# Patient Record
Sex: Female | Born: 2005 | Race: Asian | Hispanic: No | Marital: Single | Smoking: Never smoker
Health system: Southern US, Community
[De-identification: ages and names within clinical notes are randomized; demographics above are authoritative.]

## PROBLEM LIST (undated history)

## (undated) DIAGNOSIS — K5900 Constipation, unspecified: Secondary | ICD-10-CM

---
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
## 2023-01-01 ENCOUNTER — Ambulatory Visit: Payer: No Typology Code available for payment source | Admitting: Sports Medicine

## 2023-01-03 ENCOUNTER — Telehealth: Payer: Self-pay

## 2023-01-03 NOTE — Telephone Encounter (Signed)
Patients mother called wanting to go over results, I informed mother that Dr. Georgina Snell is out of town so we do have the results but it has not been read by Dr. Georgina Snell and he can look at it on Monday. She did seem sort of concerned and I did let her know the findings stating normal rotator cuff and normal bicep tendon we were just not positive on the other lingo. Informed mother that we would call her Monday with further results and with next steps.

## 2023-01-07 NOTE — Progress Notes (Signed)
Shoulder MRI shows intact rotator cuff tendons and muscles.  No biceps tendon tear glenoid labrum tear.  The inferior glenoid ligament does appear to be thinned out but not torn.  This may be an overuse issue.  Recommend return to clinic to go over the results in full detail and talk about treatment options.  We may get a second opinion from a surgeon to get his or her opinion.

## 2023-01-07 NOTE — Telephone Encounter (Signed)
Per Dr. Georgina Snell:   Gregor Hams, MD 01/07/2023  6:45 AM EDT     Shoulder MRI shows intact rotator cuff tendons and muscles.  No biceps tendon tear glenoid labrum tear.  The inferior glenoid ligament does appear to be thinned out but not torn.  This may be an overuse issue.  Recommend return to clinic to go over the results in full detail and talk about treatment options.  We may get a second opinion from a surgeon to get his or her opinion.

## 2023-01-09 ENCOUNTER — Encounter: Payer: Self-pay | Admitting: Family Medicine

## 2023-01-09 ENCOUNTER — Ambulatory Visit (INDEPENDENT_AMBULATORY_CARE_PROVIDER_SITE_OTHER): Payer: No Typology Code available for payment source | Admitting: Family Medicine

## 2023-01-09 ENCOUNTER — Ambulatory Visit: Payer: No Typology Code available for payment source | Admitting: Family Medicine

## 2023-01-09 VITALS — BP 102/64 | HR 57 | Ht 64.0 in

## 2023-01-09 DIAGNOSIS — M25511 Pain in right shoulder: Secondary | ICD-10-CM

## 2023-01-09 DIAGNOSIS — G8929 Other chronic pain: Secondary | ICD-10-CM | POA: Diagnosis not present

## 2023-01-09 NOTE — Telephone Encounter (Signed)
Pt has appointment today at 2:00 to discuss.

## 2023-01-09 NOTE — Progress Notes (Unsigned)
I, Alexa Burke, CMA acting as a scribe for Lynne Leader, MD.  Alexa Burke is a 17 y.o. female who presents to [place] [facility] today for follow-up right shoulder pain. 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 Pt was last seen by Dr. Georgina Snell on 12/04/22 and was referred to [facility] PT.  Patient's mom contacted the office on 12/14/2022 reporting frustration with PT and worsening pain and an MRI arthrogram was ordered.  Today, patient reports no change in sx since last visit. No longer going to PT but continues to do HEP provided daily. No meds for sx currently.   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   MSK: Right shoulder normal motion.    Lab and Radiology Results  EXAM: MRI OF THE RIGHT SHOULDER WITH CONTRAST   TECHNIQUE: Multiplanar, multisequence MR imaging of the right shoulder was performed following the administration of intra-articular contrast.   CONTRAST:  See Injection Documentation.   COMPARISON:  Right shoulder radiographs 12/04/2022   FINDINGS: Rotator cuff: The rotator cuff tendons are intact. No partial or full-thickness tear. No significant tendinopathy.   Muscles: Normal   Biceps long head: Normal   Acromioclavicular Joint: No degenerative changes. The acromion is type 1-2 in shape. No lateral downsloping or subacromial spurring.   Glenohumeral Joint: Normal articular cartilage. No loose bodies are identified. There is a very patulous axillary recess and elongated and thinned appearing anterior and posterior bands of the  inferior glenohumeral ligament but no evidence of tear or avulsion.   Labrum: The glenoid labrum and glenohumeral ligaments are intact. The anterior labrocapsular complex is intact on the ABER sequence.   Bones: No acute bony findings.  No bone contusion or marrow edema.   IMPRESSION: 1. Normal rotator cuff tendons and shoulder musculature. 2. Intact long head biceps tendon, glenoid labrum and glenohumeral ligaments. 3. Very patulous axillary recess and elongated and thinned appearing anterior and posterior bands of the inferior glenohumeral ligament but no evidence of tear or avulsion.     Electronically Signed   By: Marijo Sanes M.D.   On: 01/02/2023 15:28 I, Lynne Leader, personally (independently) visualized and performed the interpretation of the images attached in this note.    Assessment and Plan: 17 y.o. 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  Her pain is improving a bit with relative rest away from tennis with a graduated rehab program.  MRI did not show clear obvious tear but does show some thinning of the inferior glenoid ligament on MRI arthrogram. I am concerned she may have more pathology than the MRI showed.  On the differential is thoracic outlet syndrome which I think is less likely but is a possibility.  She seems to be improving so she can continue return to tennis PT program.  She is interested in switching PTs so I have referred her to a different physical therapy location.  However I have also going to get her second opinion with Dr. Sammuel Hines.  I would like him to double check to make sure  he agrees that were not missing anything.  I do think thoracic outlet syndrome is very likely but I would appreciate some backup.   PDMP not reviewed this encounter. Orders Placed This Encounter  Procedures   Ambulatory referral to Orthopedic Surgery    Referral Priority:   Routine    Referral Type:   Surgical    Referral Reason:    Specialty Services Required    Referred to Provider:   Vanetta Mulders, MD    Requested Specialty:   Orthopedic Surgery    Number of Visits Requested:   1   Ambulatory referral to Physical Therapy    Referral Priority:   Routine    Referral Type:   Physical Medicine    Referral Reason:   Specialty Services Required    Requested Specialty:   Physical Therapy    Number of Visits Requested:   1   No orders of the defined types were placed in this encounter.    Discussed warning signs or symptoms. Please see discharge instructions. Patient expresses understanding.   The above documentation has been reviewed and is accurate and complete Lynne Leader, M.D.

## 2023-01-09 NOTE — Patient Instructions (Addendum)
Thank you for coming in today.   You should hear from Dr Eddie Dibbles office soon.   You can call him as well.   Ok to continue return to play exercises.   I've referred you to Physical Therapy.  Let us know if you don't hear from them in one week.

## 2023-01-16 ENCOUNTER — Ambulatory Visit (HOSPITAL_BASED_OUTPATIENT_CLINIC_OR_DEPARTMENT_OTHER): Payer: No Typology Code available for payment source | Admitting: Orthopaedic Surgery

## 2023-01-16 ENCOUNTER — Ambulatory Visit: Payer: No Typology Code available for payment source | Admitting: Family Medicine

## 2023-01-16 DIAGNOSIS — G2589 Other specified extrapyramidal and movement disorders: Secondary | ICD-10-CM | POA: Diagnosis not present

## 2023-01-16 NOTE — Progress Notes (Signed)
Chief Complaint: Right shoulder pain     History of Present Illness:    Alexa Burke is a 17 y.o. female presents today as a referral from Dr. Denyse Amass for further assessment of second opinion of her right shoulder pain.  She states that she does have a history of bilateral popping of both shoulders.  This is probably in the posterior aspect of the right shoulder.  She has been having pain with overhead activity predominantly tenderness.  She is going to [employer] to play tennis this upcoming year although she is abstaining from this as result of lateral right shoulder pain which occurs with overhead activity.  She has had an MRI with contrast and is here today for further discussion.  Denies any history of ligamentous laxity or shoulder instability    Surgical History:   None  PMH/PSH/Family History/Social History/Meds/Allergies:    Past Medical History:  Diagnosis Date   Constipation    No past surgical history on file. Social History   Socioeconomic History   Marital status: Single    Spouse name: Not on file   Number of children: Not on file   Years of education: Not on file   Highest education level: Not on file  Occupational History   Not on file  Tobacco Use   Smoking status: Never   Smokeless tobacco: Not on file  Substance and Sexual Activity   Alcohol use: Not on file   Drug use: Not on file   Sexual activity: Not on file  Other Topics Concern   Not on file  Social History Narrative   Not on file   Social Determinants of Health   Financial Resource Strain: Not on file  Food Insecurity: Not on file  Transportation Needs: Not on file  Physical Activity: Not on file  Stress: Not on file  Social Connections: Not on file   No family history on file. No Known Allergies Current Outpatient Medications  Medication Sig Dispense Refill   nitroGLYCERIN (NITRODUR - DOSED IN MG/24 HR) 0.2 mg/hr patch Apply 1/4 patch daily to  tendon for tendonitis. (Patient not taking: Reported on 01/09/2023) 30 patch 1   No current facility-administered medications for this visit.   No results found.  Review of Systems:   A ROS was performed including pertinent positives and negatives as documented in the HPI.  Physical Exam :   Constitutional: NAD and appears stated age Neurological: Alert and oriented Psych: Appropriate affect and cooperative There were no vitals taken for this visit.   Comprehensive Musculoskeletal Exam:    Right shoulder as well as left shoulder with significant scapular winging with a very large subscapular pop on the left side with forward elevation.  She does sit with significant scapular protraction.  Range of motion is full and equal to 170 degrees forward elevation external rotation at the side is to 80 degrees and internal rotation is to T12 bilaterally without pain.  She has full strength bilaterally.  Negative O'Brien testing.  No tenderness over the biceps.  Negative speeds  Imaging:   Xray (3 views right shoulder): Normal  MRI (right shoulder): Normal  I personally reviewed and interpreted the radiographs.   Assessment:   17 y.o. female with right shoulder pain consistent with scapular dyskinesis and resulting anterior impingement which is causing  rotator cuff type tendinitis as well as biceps tendinitis.  I do agree with Dr. Zollie Pee assessment that this is a type of pain that can be treated predominantly with strengthening and retraining of her posterior scapular chain.  I have advised that I would like her to work aggressively with physical therapy specifically I do believe that she would also be a candidate for some dry needling of the right trapezius muscle as this does tend to compensate and flareup.  I have offered and advised that should her subscapular bursal clicking become painful that I could provide an ultrasound-guided injection into this region although given the fact that this is  not a list I do believe that the predominant acid of improving her shoulder would be to improve her scapular winging so as to not create a dynamic rotator cuff impingement while she is participating in overhead activity.  Plan :    -Physical therapy ordered.  I have asked that she follow back up if needed as she returns to tennis if she is having persistent pain.     I personally saw and evaluated the patient, and participated in the management and treatment plan.  Huel Cote, MD Attending Physician, Orthopedic Surgery  This document was dictated using Dragon voice recognition software. A reasonable attempt at proof reading has been made to minimize errors.

## 2023-01-18 ENCOUNTER — Telehealth: Payer: Self-pay | Admitting: Orthopaedic Surgery

## 2023-01-18 NOTE — Telephone Encounter (Signed)
Patient's mom called. She would like a referral for PT. [facility] [employer]

## 2023-01-21 ENCOUNTER — Other Ambulatory Visit (HOSPITAL_BASED_OUTPATIENT_CLINIC_OR_DEPARTMENT_OTHER): Payer: Self-pay | Admitting: Orthopaedic Surgery

## 2023-01-21 DIAGNOSIS — G2589 Other specified extrapyramidal and movement disorders: Secondary | ICD-10-CM

## 2023-01-21 NOTE — Telephone Encounter (Signed)
Referral placed in chart to [facility]

## 2023-01-30 ENCOUNTER — Ambulatory Visit: Payer: No Typology Code available for payment source | Attending: Orthopaedic Surgery

## 2023-01-30 DIAGNOSIS — R293 Abnormal posture: Secondary | ICD-10-CM | POA: Insufficient documentation

## 2023-01-30 DIAGNOSIS — G2589 Other specified extrapyramidal and movement disorders: Secondary | ICD-10-CM | POA: Insufficient documentation

## 2023-01-30 NOTE — Therapy (Signed)
OUTPATIENT PHYSICAL THERAPY SHOULDER EVALUATION   Patient Name: Alexa Burke MRN: [id number] DOB:[birth year], 17 y.o., female Today's Date: 01/30/2023  END OF SESSION:  PT End of Session - 01/30/23 1659     Visit Number 1    PT Start Time 1700    PT Stop Time 1730    PT Time Calculation (min) 30 min    Activity Tolerance Patient tolerated treatment well    Behavior During Therapy [facility] for tasks assessed/performed             Past Medical History:  Diagnosis Date   Constipation    History reviewed. No pertinent surgical history. Patient Active Problem List   Diagnosis Date Noted   Chronic right shoulder pain 12/31/2022    PCP: [facility]  REFERRING PROVIDER: Huel Cote  REFERRING DIAG:  G25.89 (ICD-10-CM) - Scapular dyskinesis  Chronic R shoulder pain    THERAPY DIAG:  Scapular dyskinesis  Abnormal posture  Rationale for Evaluation and Treatment: Rehabilitation  ONSET DATE: 12/31/22  SUBJECTIVE:                                                                                                                                                                                      SUBJECTIVE STATEMENT: I was getting PT at a different place but I did not get any exercises, they would just crack my arm and move it around. This was like 3-4 weeks ago. I have not had pain in a while. I took a really long 8 weeks break.   Hand dominance: Right  PERTINENT HISTORY: Alexa Burke is a 17 y.o. female who presents with right shoulder pain. 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 Patient's mom contacted the office on 12/14/2022 reporting frustration with PT and worsening pain and an MRI arthrogram was ordered. She states that she does have a history of bilateral popping of both shoulders. This is probably in the posterior aspect of the right shoulder. She has been having pain with overhead activity  predominantly tenderness. She is going to [employer] to play tennis this upcoming year although she is abstaining from this as result of lateral right shoulder pain which occurs with overhead activity.    PAIN:  Are you having pain? No  PRECAUTIONS: None  WEIGHT BEARING RESTRICTIONS: No  FALLS:  Has patient fallen in last 6 months? No  LIVING ENVIRONMENT: Lives with: lives with their family Lives in: House/apartment  OCCUPATION: Incoming freshman at [employer]   PLOF: Independent  PATIENT GOALS:to just get some exercises and make sure I am good and nothing is going  on with the shoulder  NEXT MD VISIT:   OBJECTIVE:   DIAGNOSTIC FINDINGS:  MRI IMPRESSION: 1. Normal rotator cuff tendons and shoulder musculature. 2. Intact long head biceps tendon, glenoid labrum and glenohumeral ligaments. 3. Very patulous axillary recess and elongated and thinned appearing anterior and posterior bands of the inferior glenohumeral ligament but no evidence of tear or avulsion.  Ultrasound Impression: Subacromial bursitis is visible.    COGNITION: Overall cognitive status: Within functional limits for tasks assessed     SENSATION: WFL  POSTURE: normal  UPPER EXTREMITY ROM: WFL    UPPER EXTREMITY MMT: 5/5   SHOULDER SPECIAL TESTS: Impingement tests: Neer impingement test: negative, Hawkins/Kennedy impingement test: negative, and Painful arc test: negative Biceps assessment: Speed's test: positive   JOINT MOBILITY TESTING:  WNL  PALPATION:  No remarkable findings   TODAY'S TREATMENT:                                                                                                                                         DATE: 01/30/23-EVAL   PATIENT EDUCATION: Education details: HEP Person educated: Patient Education method: Explanation Education comprehension: verbalized understanding  HOME EXERCISE PROGRAM: Access Code: [facility] URL:  [email or web address] Date: 01/30/2023 Prepared by: Cassie Freer  Exercises - Shoulder External Rotation and Scapular Retraction with Resistance  - 2 x daily - 7 x weekly - 2 sets - 10 reps - Standing Shoulder Horizontal Abduction with Resistance  - 2 x daily - 7 x weekly - 2 sets - 10 reps - Standing Shoulder Diagonal Horizontal Abduction 60/120 Degrees with Resistance  - 2 x daily - 7 x weekly - 2 sets - 10 reps - Shoulder Flexion Serratus Activation with Resistance  - 2 x daily - 7 x weekly - 2 sets - 10 reps - Standing Plank on Wall with Reaches and Resistance  - 2 x daily - 7 x weekly - 2 sets - 10 reps  ASSESSMENT:  CLINICAL IMPRESSION: Patient is a 17 y.o. female who was seen today for physical therapy evaluation and treatment for shoulder pain. She reports no pain, and states it has gotten a lot better. She took a long hiatus from tennis and has returned to playing for about 2 week. Has not experienced any pain. She does present with some scapular winging but has equal movements in scapulae with movements. She has full strength bilaterally, no tenderness, and is negative for shoulder special tests. She has a lot of clickling and popping in her shoulders, but reports she has always had this and it does not hurt. Patient would like to just get some exercises to work on as precaution. Gave her an HEP that focuses on strength mostly for posterior chain. Able to demonstrate all exercises without pain. She has a trainer helping her currently and will have people helping her at [facility] to prevent any possible impingement, bursitis, etc  to protect her R shoulder.     REHAB POTENTIAL: Excellent  CLINICAL DECISION MAKING: Stable/uncomplicated  EVALUATION COMPLEXITY: Low   GOALS: Goals reviewed with patient? Yes  SHORT TERM GOALS: Target date: 01/30/23  Pt will verbalize understanding of role/purpose of physical therapy services and be able to independently identify if/when  she may need to seek an add'l referral for services to assist w/ participation in functional activities Goal status: MET   PLAN:  PT FREQUENCY: one time visit  Skilled PT services are not warranted at this time; pt encouraged to call back with any changes in functional status or limitations.     [place], PT 01/30/2023, 5:34 PM

## 2023-12-19 ENCOUNTER — Ambulatory Visit: Admitting: Family Medicine

## 2024-02-04 NOTE — Progress Notes (Signed)
 Joanna Muck, PhD, LAT, ATC acting as a scribe for Garlan Juniper, MD.  Alexa Burke is a 18 y.o. female who presents to [facility] today for R wrist pain x 8-10 wks. Pt has been seen previously at [facility], last visit on the 9th. Pt locates pain to the ulnar aspect of the wrist, distal to the ulnar styloid.  She is a Engineer, building services.  She was a [employer] but will be transferring to a different school.  She has had care for this wrist pain at [facility] including hand therapy physical therapy and conservative management with athletic trainers.  This has been ongoing for years but she has had 6 weeks of conservative management over the last 6 months including home exercise program and hand therapy.   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  Right ulnar wrist pain.   Exam:  BP 104/74   Pulse 65   Ht 5\' 4"  (1.626 m)   Wt 161 lb (73 kg)   SpO2 97%   BMI 27.64 kg/m  General: Well Developed, well nourished, and in no acute distress.   MSK: Right wrist: Normal appearing Tender palpation TFCC area.  Normal wrist motion.    Lab and Radiology Results  XR WRIST MINIMUM 3 VIEWS RIGHT, 01/02/2024 1:49 PM  INDICATION: tennis player, r wrist pain ulnar side, Pain in right wrist COMPARISON: None.  IMPRESSION: 1.  No acute fracture. 2.  No dislocation. Mild positive variance. 3.  Joint spaces are maintained.  Exam End: 01/02/24 13:49       Assessment and Plan: 18 y.o. female with chronic right wrist pain located at the ulnar side of the wrist concerning for TFCC tear. Will proceed to MRI arthrogram.  She has had a good trial of conservative management  already and has not improved sufficiently.   [facility]  PDMP not reviewed this encounter. Orders Placed This Encounter  Procedures   MR WRIST RIGHT W CONTRAST    Standing Status:   Future    Expiration Date:   02/04/2025    Reason for Exam (SYMPTOM  OR DIAGNOSIS REQUIRED):   chronic right wirst pain    If indicated for the ordered procedure, I authorize the administration of contrast media per Radiology protocol:   Yes    What is the patient's sedation requirement?:   No Sedation    Does the patient have a pacemaker or implanted devices?:   No    Preferred imaging location?:   [facility] (table limit-550lbs)   DG FLUORO GUIDED NEEDLE PLC ASPIRATION/INJECTION LOC    Standing Status:   Future    Expiration Date:   02/04/2025    Scheduling Instructions:     Schedule 1 hour prior for injection    Lab orders requested (DO NOT place separate lab orders, these will be automatically ordered during procedure specimen collection)::   None    Reason for Exam (SYMPTOM  OR DIAGNOSIS REQUIRED):   chronic right wrist pain    Is the patient pregnant?:   No    Preferred Imaging Location?:   [facility]   No orders of the defined types were placed in this encounter.  Discussed warning signs or symptoms. Please see discharge instructions. Patient expresses understanding.   The above documentation has been reviewed and is accurate and complete Garlan Juniper, M.D.

## 2024-02-05 ENCOUNTER — Ambulatory Visit: Admitting: Family Medicine

## 2024-02-05 VITALS — BP 104/74 | HR 65 | Ht 64.0 in | Wt 161.0 lb

## 2024-02-05 DIAGNOSIS — M25531 Pain in right wrist: Secondary | ICD-10-CM

## 2024-02-05 NOTE — Patient Instructions (Addendum)
 Thank you for coming in today.   You should hear from MRI scheduling within 1 week. If you do not hear please let me know.    Check back after we get the results

## 2024-02-06 ENCOUNTER — Encounter: Payer: Self-pay | Admitting: Family Medicine

## 2024-02-06 ENCOUNTER — Telehealth: Payer: Self-pay | Admitting: Family Medicine

## 2024-02-06 NOTE — Telephone Encounter (Signed)
 Patient's mom called stating that they have the MRI scheduled with [facility] but it is not until 5/21. They were told that we could send somewhere else if [facility] was too far out.  Please advise.

## 2024-02-07 NOTE — Addendum Note (Signed)
 Addended by: Sheria Dills on: 02/07/2024 08:24 AM   Modules accepted: Orders

## 2024-02-10 ENCOUNTER — Ambulatory Visit: Admitting: Sports Medicine

## 2024-02-10 ENCOUNTER — Ambulatory Visit (INDEPENDENT_AMBULATORY_CARE_PROVIDER_SITE_OTHER)

## 2024-02-10 ENCOUNTER — Ambulatory Visit

## 2024-02-10 ENCOUNTER — Encounter: Payer: Self-pay | Admitting: Sports Medicine

## 2024-02-10 DIAGNOSIS — M25531 Pain in right wrist: Secondary | ICD-10-CM

## 2024-02-10 DIAGNOSIS — G8929 Other chronic pain: Secondary | ICD-10-CM | POA: Diagnosis not present

## 2024-02-10 MED ORDER — TRIAMCINOLONE ACETONIDE 40 MG/ML IJ SUSP
40.0000 mg | Freq: Once | INTRAMUSCULAR | Status: AC
  Administered 2024-02-10: 40 mg via INTRAMUSCULAR

## 2024-02-10 MED ORDER — GADOBUTROL 1 MMOL/ML IV SOLN
1.0000 mL | Freq: Once | INTRAVENOUS | Status: AC | PRN
  Administered 2024-02-10: 1 mL via INTRAVENOUS

## 2024-02-10 NOTE — Progress Notes (Signed)
    Procedures performed today:    Procedure: Real-time Ultrasound Guided gadolinium contrast injection of right radiocarpal joint Device: Samsung HS60  Verbal informed consent obtained.  Time-out conducted.  Noted no overlying erythema, induration, or other signs of local infection.  Skin prepped in a sterile fashion.  Local anesthesia: Topical Ethyl chloride.  With sterile technique and under real time ultrasound guidance: Noted some mild synovitis, trace effusion, 25-gauge needle advanced into the joint from a dorsal approach, injected 1 cc kenalog 40, 1 cc lidocaine, syringe switched and 0.05 gadolinium injected, syringe again switched in approximately 2 to 3 mL sterile saline used to fully distend the joint. Joint visualized and capsule seen distending confirming intra-articular placement of contrast material and medication. Completed without difficulty  Advised to call if fevers/chills, erythema, induration, drainage, or persistent bleeding.  Images permanently stored in PACS Impression: Technically successful ultrasound guided gadolinium contrast injection for MR arthrography.  Please see separate MR arthrogram report.  Independent interpretation of notes and tests performed by another provider:   None.  Brief History, Exam, Impression, and Recommendations:    Chronic pain of right wrist Injection performed today for MR arthrography, further management per primary treating provider.    ____________________________________________ Joselyn Nicely. Sandy Crumb, M.D., ABFM., CAQSM., AME. Primary Care and Sports Medicine [facility]  Adjunct Professor of [facility] Medicine  [employer]  Restaurant manager, fast food

## 2024-02-10 NOTE — Assessment & Plan Note (Signed)
Injection performed today for MR arthrography, further management per primary treating provider. 

## 2024-02-20 ENCOUNTER — Ambulatory Visit: Payer: Self-pay | Admitting: Family Medicine

## 2024-02-20 NOTE — Progress Notes (Signed)
 Good news.  MRI shows some tendinitis in the wrist which should be able to improve with physical therapy.  No evidence of TFCC tear.

## 2024-02-26 ENCOUNTER — Other Ambulatory Visit

## 2024-03-09 ENCOUNTER — Ambulatory Visit: Admitting: Sports Medicine

## 2024-05-06 ENCOUNTER — Other Ambulatory Visit: Payer: Self-pay

## 2024-05-06 ENCOUNTER — Ambulatory Visit: Admitting: Family Medicine

## 2024-05-06 VITALS — BP 104/70 | HR 67 | Ht 64.0 in | Wt 147.0 lb

## 2024-05-06 DIAGNOSIS — M25531 Pain in right wrist: Secondary | ICD-10-CM | POA: Diagnosis not present

## 2024-05-06 NOTE — Patient Instructions (Addendum)
 Thank you for coming in today.   Continue to work home exercises  Please get labs today before you leave   Contact your athletic trainer when you get to campus

## 2024-05-06 NOTE — Progress Notes (Signed)
 [name], PhD, LAT, ATC acting as a scribe for Alexa Lloyd, MD.  Alexa Burke is a 18 y.o. female who presents to [facility] today for R arm pain. Pt was previously seen by Dr. Lloyd on 02/05/24 for R wrist pain and a MRI-arthrogram was ordered.   Today, pt c/o R arm pain x 4-5 days ago. Pt locates pain to along the 3rd MC, into forearm. She has been playing 2-4 hours of tennis per day.   Pt recently got a scholarship to play tennis at [employer]  Patient has a single backhand.  She is right-hand dominant.  Radiates: yes UE Numbness/tingling: no UE Weakness: no Aggravates: wrist flexion, backhand, tennis Treatments tried: 1 day of rest, ice, IBU  Pertinent review of systems: No fevers or chills  Relevant historical information: Prior wrist tendinitis   Exam:  BP 104/70   Pulse 67   Ht 5' 4 (1.626 m)   Wt 147 lb (66.7 kg)   SpO2 98%   BMI 25.23 kg/m  General: Well Developed, well nourished, and in no acute distress.   MSK: Right forearm normal-appearing Nontender palpation.  Normal wrist and hand motion.  Strength is intact.  Unable to reproduce pain with resisted wrist extension or finger extension.    Lab and Radiology Results   EXAM: MRI OF THE RIGHT WRIST WITH CONTRAST (MR Arthrogram)   TECHNIQUE: Multiplanar, multisequence MR imaging of the wrist was performed immediately following contrast injection into the radiocarpal joint under fluoroscopic guidance. No intravenous contrast was administered.   COMPARISON:  MR arthrogram right wrist 12/14/2020.   FINDINGS: Ligaments: Intact.   Triangular fibrocartilage: Intact.   Tendons: Intact. There is minimally increased T2 signal and thickening of the extensor carpi ulnaris compatible with mild tendinosis. No evidence of ECU subsheath tear.   Carpal tunnel/median nerve: Normal.   Guyon's canal: Normal.   Joint/cartilage: Normal.   Bones/carpal alignment:  Normal.   Other: None.   IMPRESSION: Mild extensor carpi ulnaris tendinosis. The exam is otherwise negative.     Electronically Signed   By: Alexa Burke M.D.   On: 02/19/2024 09:18   I, Alexa Burke, personally (independently) visualized and performed the interpretation of the images attached in this note.      Assessment and Plan: 18 y.o. female with right dorsal wrist and forearm pain.  MR arthrogram from about 3 months ago does show ECU tendinitis but no tear.  Today symptoms are much more consistent with fourth dorsal wrist compartment tendinitis.  Fundamentally she has had recurrent and ongoing tendinitis of the wrist extensors.  Plan to continue home exercise program and consider Voltaren gel.  We did talk a bit about modification.  She has appropriate over grip and strength tension on her tennis racquet.  She does have a relatively unusual for the sports single backhand.  I wonder if a double back can would allow her to have similar or more power while experiencing less load on that right wrist extensor group.  Advise talking with her coach to make that decision.  Additionally she is about to report to college to play tennis at [employer].  Recommend talking with the athletic trainer and coach on arrival.   PDMP not reviewed this encounter. No orders of the defined types were placed in this encounter.  No orders of the defined types were placed in this encounter.    Discussed warning signs or symptoms. Please see discharge instructions. Patient  expresses understanding.   The above documentation has been reviewed and is accurate and complete Alexa Burke, M.D.

## 2024-06-09 ENCOUNTER — Encounter: Payer: Self-pay | Admitting: Sports Medicine
